# Patient Record
Sex: Female | Born: 1995 | Race: Black or African American | Hispanic: No | Marital: Single | Smoking: Never smoker
Health system: Southern US, Community
[De-identification: ages and names within clinical notes are randomized; demographics above are authoritative.]

## PROBLEM LIST (undated history)

## (undated) DIAGNOSIS — H905 Unspecified sensorineural hearing loss: Secondary | ICD-10-CM

## (undated) HISTORY — PX: WISDOM TOOTH EXTRACTION: SHX21

## (undated) HISTORY — DX: Unspecified sensorineural hearing loss: H90.5

---

## 2020-04-07 ENCOUNTER — Other Ambulatory Visit: Payer: Self-pay

## 2020-04-07 ENCOUNTER — Emergency Department (HOSPITAL_COMMUNITY)
Admission: EM | Admit: 2020-04-07 | Discharge: 2020-04-07 | Disposition: A | Discharge status: Home / Self Care | Payer: BC Managed Care – PPO | Attending: Emergency Medicine | Admitting: Emergency Medicine

## 2020-04-07 ENCOUNTER — Emergency Department (HOSPITAL_COMMUNITY): Payer: BC Managed Care – PPO

## 2020-04-07 DIAGNOSIS — R0789 Other chest pain: Secondary | ICD-10-CM | POA: Insufficient documentation

## 2020-04-07 LAB — TROPONIN I (HIGH SENSITIVITY)
Troponin I (High Sensitivity): <2 ng/L (ref <18)
Troponin I (High Sensitivity): <2 ng/L (ref <18)

## 2020-04-07 LAB — I-STAT BETA HCG BLOOD, ED (MC, WL, AP ONLY): I-stat hCG, quantitative: <5 m[IU]/mL (ref <5)

## 2020-04-07 LAB — BASIC METABOLIC PANEL
Anion gap: 12 (ref 5–15)
BUN: 9 mg/dL (ref 6–20)
CO2: 21 mmol/L — ABNORMAL LOW (ref 22–32)
Calcium: 9.4 mg/dL (ref 8.9–10.3)
Chloride: 107 mmol/L (ref 98–111)
Creatinine, Ser: 0.89 mg/dL (ref 0.44–1.00)
GFR calc Af Amer: >60 mL/min (ref >60)
GFR calc non Af Amer: >60 mL/min (ref >60)
Glucose, Bld: 99 mg/dL (ref 70–99)
Potassium: 3.9 mmol/L (ref 3.5–5.1)
Sodium: 140 mmol/L (ref 135–145)

## 2020-04-07 LAB — CBC
HCT: 41.4 % (ref 36.0–46.0)
Hemoglobin: 13.7 g/dL (ref 12.0–15.0)
MCH: 32 pg (ref 26.0–34.0)
MCHC: 33.1 g/dL (ref 30.0–36.0)
MCV: 96.7 fL (ref 80.0–100.0)
Platelets: 304 10*3/uL (ref 150–400)
RBC: 4.28 MIL/uL (ref 3.87–5.11)
RDW: 12 % (ref 11.5–15.5)
WBC: 6.8 10*3/uL (ref 4.0–10.5)
nRBC: 0 % (ref 0.0–0.2)

## 2020-04-07 MED ORDER — DICLOFENAC SODIUM 75 MG PO TBEC: 75.0000 mg | DELAYED_RELEASE_TABLET | Freq: Two times a day (BID) | ORAL | 0 refills | Status: DC

## 2020-04-07 MED ORDER — ACETAMINOPHEN 500 MG PO TABS
500.0000 mg | ORAL_TABLET | Freq: Four times a day (QID) | ORAL | 0 refills | Status: AC | PRN
Start: 2020-04-07 — End: ?

## 2020-04-07 MED ORDER — SODIUM CHLORIDE 0.9% FLUSH: 3.0000 mL | Freq: Once | INTRAVENOUS | Status: DC

## 2020-04-07 NOTE — ED Notes (Signed)
Rn notified by EMT that pt c/o CP. Pt reports to RN that CP is the same pain as earlier.

## 2020-04-07 NOTE — ED Provider Notes (Signed)
[facility] EMERGENCY DEPARTMENT Provider Note   CSN: [id number] Arrival date & time: 04/07/20  0043     History Chief Complaint  Patient presents with  . Chest Pain    Brenda Costa is a 24 y.o. female presents for evaluation of acute onset, persistent chest pains for 1 week.  Reports pain is sharp and tight, mostly along the midline radiating a little to the left.  Pain worsens with position changes, laying flat, rotating to the right.  Denies shortness of breath, abdominal pain, nausea, vomiting, lightheadedness, diaphoresis, fevers or cough.  She has not tried anything for her symptoms.  She is a non-smoker, denies recreational drug use, no family history of heart disease under the age of 50.  The history is provided by the patient.       No past medical history on file.  There are no problems to display for this patient.    OB History   No obstetric history on file.     No family history on file.  Social History   Tobacco Use  . Smoking status: Not on file  Substance Use Topics  . Alcohol use: Not on file  . Drug use: Not on file    Home Medications Prior to Admission medications   Medication Sig Start Date End Date Taking? Authorizing Provider  acetaminophen (TYLENOL) 500 MG tablet Take 1 tablet (500 mg total) by mouth every 6 (six) hours as needed. 04/07/20   Maral Lampe A, PA-C  diclofenac (VOLTAREN) 75 MG EC tablet Take 1 tablet (75 mg total) by mouth 2 (two) times daily. 04/07/20   Michela Pitcher A, PA-C    Allergies    Patient has no allergy information on record.  Review of Systems   Review of Systems  Constitutional: Negative for chills, diaphoresis and fever.  Respiratory: Negative for shortness of breath.   Cardiovascular: Positive for chest pain.  Gastrointestinal: Negative for abdominal pain, nausea and vomiting.  All other systems reviewed and are negative.   Physical Exam Updated Vital Signs BP 125/84 (BP Location: Right Arm)    Pulse 78   Temp 98.3 F (36.8 C) (Oral)   Resp 19   SpO2 100%   Physical Exam Vitals and nursing note reviewed.  Constitutional:      General: She is not in acute distress.    Appearance: She is well-developed.  HENT:     Head: Normocephalic and atraumatic.  Eyes:     General:        Right eye: No discharge.        Left eye: No discharge.     Extraocular Movements: Extraocular movements intact.     Conjunctiva/sclera: Conjunctivae normal.  Neck:     Vascular: No JVD.     Trachea: No tracheal deviation.  Cardiovascular:     Rate and Rhythm: Normal rate and regular rhythm.     Pulses:          Radial pulses are 2+ on the right side and 2+ on the left side.       Dorsalis pedis pulses are 2+ on the right side and 2+ on the left side.       Posterior tibial pulses are 2+ on the right side and 2+ on the left side.     Comments: 2+ radial and DP/PT pulses bilaterally, Homans sign absent bilaterally, no lower extremity edema, no palpable cords, compartments are soft  Pulmonary:     Effort: Pulmonary effort  is normal.     Comments: Speaking in full sentences without difficulty, SPO2 saturations 100% on room air Chest:     Chest wall: Tenderness present.       Comments: Focal left parasternal chest wall tenderness on palpation with no deformity, crepitus, ecchymosis, or flail segment. Abdominal:     General: There is no distension.     Palpations: Abdomen is soft.     Tenderness: There is no abdominal tenderness.  Musculoskeletal:     Cervical back: Normal range of motion and neck supple.     Right lower leg: No edema.     Left lower leg: No edema.  Skin:    General: Skin is warm and dry.     Findings: No erythema.  Neurological:     Mental Status: She is alert.  Psychiatric:        Behavior: Behavior normal.     ED Results / Procedures / Treatments   Labs (all labs ordered are listed, but only abnormal results are displayed) Labs Reviewed  BASIC METABOLIC PANEL  - Abnormal; Notable for the following components:      Result Value   CO2 21 (*)    All other components within normal limits  CBC  I-STAT BETA HCG BLOOD, ED ([facility], [facility], [facility] ONLY)  TROPONIN I (HIGH SENSITIVITY)  TROPONIN I (HIGH SENSITIVITY)    EKG EKG Interpretation  Date/Time:  Thursday April 07 2020 02:56:16 EDT Ventricular Rate:  86 PR Interval:  170 QRS Duration: 68 QT Interval:  348 QTC Calculation: 416 R Axis:   69 Text Interpretation: Normal sinus rhythm with sinus arrhythmia Normal ECG Confirmed by Lajean Saver [phone]) on 04/07/2020 7:39:39 AM   Radiology DG Chest 2 View  Result Date: 04/07/2020 CLINICAL DATA:  Chest pain for 1 week EXAM: CHEST - 2 VIEW COMPARISON:  None. FINDINGS: The heart size and mediastinal contours are within normal limits. Both lungs are clear. The visualized skeletal structures are unremarkable. IMPRESSION: No active cardiopulmonary disease. Electronically Signed   By: Inez Catalina M.D.   On: 04/07/2020 02:07    Procedures Procedures (including critical care time)  Medications Ordered in ED Medications  sodium chloride flush (NS) 0.9 % injection 3 mL (3 mLs Intravenous Not Given 04/07/20 0747)    ED Course  I have reviewed the triage vital signs and the nursing notes.  Pertinent labs & imaging results that were available during my care of the patient were reviewed by me and considered in my medical decision making (see chart for details).    MDM Rules/Calculators/A&P                      Patient presenting for evaluation of left-sided chest pains for 1 week. Pain is not pleuritic or exertional in nature.  It is very much so reproducible on palpation suggesting musculoskeletal etiology of symptoms.  She is afebrile, vital signs are stable and she is nontoxic in appearance.  EKG shows no acute ischemic abnormalities or concerning arrhythmia.  Chest x-ray shows no acute cardiopulmonary abnormalities.  No evidence of pneumonia or pneumothorax or  cardiomegaly.  I have a low suspicion of PE at this time.  Remainder of lab work reviewed and interpreted by myself shows no leukocytosis, no anemia, no metabolic derangements, no renal insufficiency.  Doubt dissection, cardiac tamponade, esophageal rupture, pneumonia, or acute surgical abdominal pathology and otherwise well-appearing young individual.    She has no cardiac risk factors.  Suspect his  symptoms could be related to costochondritis.  Doubt ACS/MI. Discussed symptomatic management with NSAIDs, Tylenol, gentle stretching.  Recommend follow-up with primary care provider for reevaluation of symptoms.  Discussed strict ED return precautions. Patient verbalized understanding of and agreement with plan and  is safe for discharge home at this time.    Final Clinical Impression(s) / ED Diagnoses Final diagnoses:  Atypical chest pain  Acute chest wall pain    Rx / DC Orders ED Discharge Orders         Ordered    diclofenac (VOLTAREN) 75 MG EC tablet  2 times daily     04/07/20 0822    acetaminophen (TYLENOL) 500 MG tablet  Every 6 hours PRN     04/07/20 0822           Jeanie Sewer, PA-C 04/07/20 0347    Cathren Laine, MD 04/07/20 [phone]

## 2020-04-07 NOTE — ED Triage Notes (Signed)
per pt x 1 week has been having chest pain. No SOB. Pt said she cant lay flat on her stomach or on her back. Pt said sore to touch in the center.

## 2020-04-07 NOTE — Discharge Instructions (Addendum)
Your work-up today was reassuring, with no evidence of heart attack, heart failure or other serious life-threatening condition.  I suspect your pain is likely musculoskeletal or related to a condition called costochondritis.  We typically recommend treating this with anti-inflammatories.  Take diclofenac twice daily with meals.  Do not take ibuprofen, Advil, Aleve, or Motrin while taking this medication.  You can take extra strength Tylenol every 6 hours additionally as needed for pain.  Can also apply ice or heat to the chest wall as needed for comfort, 20 minutes at a time 3-4 times daily.  Do some gentle stretching after this to avoid muscle stiffness.  You can also apply topical creams such as icy hot or Salonpas patches for pain.  Follow-up with primary care provider for reevaluation of symptoms.  Return to the emergency department if any concerning signs or symptoms develop.

## 2020-04-07 NOTE — ED Notes (Signed)
Call Pt for vitals no answer. 

## 2020-04-07 NOTE — ED Notes (Signed)
Per pt she came up to tech and said chest pain is gertting worse so brought into triage and doing another EKG

## 2020-05-10 ENCOUNTER — Ambulatory Visit: Payer: BC Managed Care – PPO | Admitting: Internal Medicine

## 2020-05-10 ENCOUNTER — Encounter: Payer: Self-pay | Admitting: Internal Medicine

## 2020-05-10 ENCOUNTER — Other Ambulatory Visit: Payer: Self-pay

## 2020-05-10 ENCOUNTER — Encounter: Payer: Self-pay | Admitting: *Deleted

## 2020-05-10 VITALS — BP 112/68 | HR 97 | Ht 67.5 in | Wt 180.4 lb

## 2020-05-10 DIAGNOSIS — R079 Chest pain, unspecified: Secondary | ICD-10-CM | POA: Diagnosis not present

## 2020-05-10 DIAGNOSIS — R002 Palpitations: Secondary | ICD-10-CM

## 2020-05-10 DIAGNOSIS — H905 Unspecified sensorineural hearing loss: Secondary | ICD-10-CM | POA: Diagnosis not present

## 2020-05-10 NOTE — Patient Instructions (Signed)
Medication Instructions:  Your Physician recommend you continue on your current medication as directed.    *If you need a refill on your cardiac medications before your next appointment, please call your pharmacy*   Lab Work: None   Testing/Procedures: Your physician has requested that you have an echocardiogram. Echocardiography is a painless test that uses sound waves to create images of your heart. It provides your doctor with information about the size and shape of your heart and how well your heart's chambers and valves are working. This procedure takes approximately one hour. There are no restrictions for this procedure. [address]  Our physician has recommended that you wear an 7  DAY ZIO-PATCH monitor. The Zio patch cardiac monitor continuously records heart rhythm data for up to 14 days, this is for patients being evaluated for multiple types heart rhythms. For the first 24 hours post application, please avoid getting the Zio monitor wet in the shower or by excessive sweating during exercise. After that, feel free to carry on with regular activities. Keep soaps and lotions away from the ZIO XT Patch.   Someone will call from our office to mail monitor.      Follow-Up: At [facility], you and your health needs are our priority.  As part of our continuing mission to provide you with exceptional heart care, we have created designated Provider Care Teams.  These Care Teams include your primary Cardiologist (physician) and Advanced Practice Providers (APPs -  Physician Assistants and Nurse Practitioners) who all work together to provide you with the care you need, when you need it.  We recommend signing up for the patient portal called "MyChart".  Sign up information is provided on this After Visit Summary.  MyChart is used to connect with patients for Virtual Visits (Telemedicine).  Patients are able to view lab/test results, encounter notes, upcoming appointments,  etc.  Non-urgent messages can be sent to your provider as well.   To learn more about what you can do with MyChart, go to [email or web address].    Your next appointment:   6 week(s)  The format for your next appointment:   In Person  Provider:   Weston Brass, MD  Christena Deem- Long Term Monitor Instructions   Your physician has requested you wear your ZIO patch monitor___7____days.   This is a single patch monitor.  [employer] supplies one patch monitor per enrollment.  Additional stickers are not available.   Please do not apply patch if you will be having a Nuclear Stress Test, Echocardiogram, Cardiac CT, MRI, or Chest Xray during the time frame you would be wearing the monitor. The patch cannot be worn during these tests.  You cannot remove and re-apply the ZIO XT patch monitor.   Your ZIO patch monitor will be sent USPS Priority mail from [facility] directly to your home address. The monitor may also be mailed to a PO BOX if home delivery is not available.   It may take 3-5 days to receive your monitor after you have been enrolled.   Once you have received you monitor, please review enclosed instructions.  Your monitor has already been registered assigning a specific monitor serial # to you.   Applying the monitor   Shave hair from upper left chest.   Hold abrader disc by orange tab.  Rub abrader in 40 strokes over left upper chest as indicated in your monitor instructions.   Clean area with 4 enclosed alcohol pads .  Use all pads to assure are is cleaned thoroughly.  Let dry.   Apply patch as indicated in monitor instructions.  Patch will be place under collarbone on left side of chest with arrow pointing upward.   Rub patch adhesive wings for 2 minutes.Remove white label marked "1".  Remove white label marked "2".  Rub patch adhesive wings for 2 additional minutes.   While looking in a mirror, press and release button in center of patch.  A small green light will  flash 3-4 times .  This will be your only indicator the monitor has been turned on.     Do not shower for the first 24 hours.  You may shower after the first 24 hours.   Press button if you feel a symptom. You will hear a small click.  Record Date, Time and Symptom in the Patient Log Book.   When you are ready to remove patch, follow instructions on last 2 pages of Patient Log Book.  Stick patch monitor onto last page of Patient Log Book.   Place Patient Log Book in [place] box.  Use locking tab on box and tape box closed securely.  The [employer] and [employer] has [employer] on it.  Please place in mailbox as soon as possible.  Your physician should have your test results approximately 7 days after the monitor has been mailed back to [facility].   Call [place] at [phone] if you have questions regarding your ZIO XT patch monitor.  Call them immediately if you see an orange light blinking on your monitor.   If your monitor falls off in less than 4 days contact our Monitor department at [phone].  If your monitor becomes loose or falls off after 4 days call [employer] at [phone] for suggestions on securing your monitor.

## 2020-05-10 NOTE — Progress Notes (Signed)
Cardiology Office Note:    Date:  05/10/2020   ID:  Florinda Marker, DOB 09/21/1996, MRN [id number]  PCP:  System, Pcp Not In  Cardiologist:  No primary care provider on file.  Electrophysiologist:  None   Referring MD: Osker Mason, NP   Chief Complaint: chest pain  History of Present Illness:    Brenda Costa is a 24 y.o. female with a history of congenital hearing loss who presents for evaluation of chest pain.   1.5 mo ago chest pain started. Left and substernal chest pain. Position change doesn't matter. 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 Comes on and lasts minutes to hours, spontaneous onset and off. Not affected by deep breathing. No major childhood illness, however does have congenital hearing loss. Uneventful pregnancy for mother, no congenital rubella or known illness during pregnancy. Chest is intermittently tender to palpitation.   She has high cholesterol but not on therapy. Lipids not available for review.   Gained weight over past 1 year. 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   Has had syncope before - in [place] at a party, got hot and felt like was going to pass out - going to door and woke up outside. Seeing black in vision was her prodrome.   Presyncope - monthly. Not with menstrual cycle. Sometimes with migraines - from prior concussions from sports.  Not vaccinated and has not gotten COVID infection.   Father - HLD, overweight, MI x 2 with stent, in 33s. Premature CAD. No SCD in family.  She has a torn achilles tendon on right that never fully healed per her report. Prior to that was working out with issue.   She has palpitations every other day. 30 minutes then lets up. No aggravating or alleviating factors.  No caffeine, no smoking. Rare ETOH.   Past Medical History:  Diagnosis Date  . Congenital hearing disorder     History reviewed. No pertinent surgical  history.  Current Medications: Current Meds  Medication Sig  . acetaminophen (TYLENOL) 500 MG tablet Take 1 tablet (500 mg total) by mouth every 6 (six) hours as needed.  . cetirizine (ZYRTEC) 10 MG tablet Take 10 mg by mouth daily.  . cholecalciferol (VITAMIN D3) 25 MCG (1000 UNIT) tablet Take 1,000 Units by mouth daily.  . rizatriptan (MAXALT) 10 MG tablet Take 10 mg by mouth as needed for migraine. May repeat in 2 hours if needed     Allergies:   Patient has no allergy information on record.   Social History   Socioeconomic History  . Marital status: Single    Spouse name: Not on file  . Number of children: Not on file  . Years of education: Not on file  . Highest education level: Not on file  Occupational History  . Not on file  Tobacco Use  . Smoking status: Never Smoker  . Smokeless tobacco: Never Used  Substance and Sexual Activity  . Alcohol use: Yes    Comment: occasionally  . Drug use: Never  . Sexual activity: Not Currently  Other Topics Concern  . Not on file  Social History Narrative  . Not on file   Social Determinants of Health   Financial Resource Strain:   . Difficulty of Paying Living Expenses:   Food Insecurity:   . Worried About Programme researcher, broadcasting/film/video in the Last Year:   . [employer] of Food in the Last Year:  Transportation Needs:   . Freight forwarder (Medical):   Marland Kitchen Lack of Transportation (Non-Medical):   Physical Activity:   . Days of Exercise per Week:   . Minutes of Exercise per Session:   Stress:   . Feeling of Stress :   Social Connections:   . Frequency of Communication with Friends and Family:   . Frequency of Social Gatherings with Friends and Family:   . Attends Religious Services:   . Active Member of Clubs or Organizations:   . Attends Banker Meetings:   Marland Kitchen Marital Status:      Family History: The patient's family history is not on file.  ROS:   Please see the history of present illness.    All other  systems reviewed and are negative.  EKGs/Labs/Other Studies Reviewed:    The following studies were reviewed today:  EKG:  NSR, sinus arrhythmia  I have independently reviewed the images from CXR 04/07/20.  Recent Labs: 04/07/2020: BUN 9; Creatinine, Ser 0.89; Hemoglobin 13.7; Platelets 304; Potassium 3.9; Sodium 140  Recent Lipid Panel No results found for: CHOL, TRIG, HDL, CHOLHDL, VLDL, LDLCALC, LDLDIRECT  Physical Exam:    VS:  BP 112/68   Pulse 97   Ht 5' 7.5" (1.715 m)   Wt 180 lb 6.4 oz (81.8 kg)   SpO2 100%   BMI 27.84 kg/m     Wt Readings from Last 5 Encounters:  05/10/20 180 lb 6.4 oz (81.8 kg)    Constitutional: No acute distress Eyes: sclera non-icteric, normal conjunctiva and lids ENMT: normal dentition, moist mucous membranes Cardiovascular: regular rhythm, normal rate, no murmurs. S1 and S2 normal. Radial pulses normal bilaterally. No jugular venous distention.  Respiratory: clear to auscultation bilaterally GI : normal bowel sounds, soft and nontender. No distention.   MSK: extremities warm, well perfused. No edema.  NEURO: grossly nonfocal exam, moves all extremities. PSYCH: alert and oriented x 3, normal mood and affect.   ASSESSMENT:    1. Chest pain, unspecified type   2. Palpitation   3. Congenital hearing loss    PLAN:    Chest pain, unspecified type - Plan: EKG 12-Lead, ECHOCARDIOGRAM COMPLETE  Palpitation - Plan: LONG TERM MONITOR (3-14 DAYS), ECHOCARDIOGRAM COMPLETE  Congenital hearing loss - Plan: ECHOCARDIOGRAM COMPLETE   She is concerned about chest pain. In the setting of a congenital hearing disorder, best to exclude concomitant congenital cardiovascular structural issues contributing to chest pain. We also need to evaluate for pericardial disease/pericarditis as source of chest pain.   Will obtain 7 day zio for palpitations.    Weston Brass, MD [place]  [facility]    Medication Adjustments/Labs and Tests  Ordered: Current medicines are reviewed at length with the patient today.  Concerns regarding medicines are outlined above.  Orders Placed This Encounter  Procedures  . LONG TERM MONITOR (3-14 DAYS)  . EKG 12-Lead  . ECHOCARDIOGRAM COMPLETE   No orders of the defined types were placed in this encounter.   Patient Instructions  Medication Instructions:  Your Physician recommend you continue on your current medication as directed.    *If you need a refill on your cardiac medications before your next appointment, please call your pharmacy*   Lab Work: None   Testing/Procedures: Your physician has requested that you have an echocardiogram. Echocardiography is a painless test that uses sound waves to create images of your heart. It provides your doctor with information about the size and shape of your heart  and how well your heart's chambers and valves are working. This procedure takes approximately one hour. There are no restrictions for this procedure. [address]  Our physician has recommended that you wear an 7  DAY ZIO-PATCH monitor. The Zio patch cardiac monitor continuously records heart rhythm data for up to 14 days, this is for patients being evaluated for multiple types heart rhythms. For the first 24 hours post application, please avoid getting the Zio monitor wet in the shower or by excessive sweating during exercise. After that, feel free to carry on with regular activities. Keep soaps and lotions away from the ZIO XT Patch.   Someone will call from our office to mail monitor.      Follow-Up: At [facility], you and your health needs are our priority.  As part of our continuing mission to provide you with exceptional heart care, we have created designated Provider Care Teams.  These Care Teams include your primary Cardiologist (physician) and Advanced Practice Providers (APPs -  Physician Assistants and Nurse Practitioners) who all work together to  provide you with the care you need, when you need it.  We recommend signing up for the patient portal called "MyChart".  Sign up information is provided on this After Visit Summary.  MyChart is used to connect with patients for Virtual Visits (Telemedicine).  Patients are able to view lab/test results, encounter notes, upcoming appointments, etc.  Non-urgent messages can be sent to your provider as well.   To learn more about what you can do with MyChart, go to [email or web address].    Your next appointment:   6 week(s)  The format for your next appointment:   In Person  Provider:   Weston Brass, MD  Christena Deem- Long Term Monitor Instructions   Your physician has requested you wear your ZIO patch monitor___7____days.   This is a single patch monitor.  [employer] supplies one patch monitor per enrollment.  Additional stickers are not available.   Please do not apply patch if you will be having a Nuclear Stress Test, Echocardiogram, Cardiac CT, MRI, or Chest Xray during the time frame you would be wearing the monitor. The patch cannot be worn during these tests.  You cannot remove and re-apply the ZIO XT patch monitor.   Your ZIO patch monitor will be sent USPS Priority mail from [facility] directly to your home address. The monitor may also be mailed to a PO BOX if home delivery is not available.   It may take 3-5 days to receive your monitor after you have been enrolled.   Once you have received you monitor, please review enclosed instructions.  Your monitor has already been registered assigning a specific monitor serial # to you.   Applying the monitor   Shave hair from upper left chest.   Hold abrader disc by orange tab.  Rub abrader in 40 strokes over left upper chest as indicated in your monitor instructions.   Clean area with 4 enclosed alcohol pads .  Use all pads to assure are is cleaned thoroughly.  Let dry.   Apply patch as indicated in monitor instructions.  Patch  will be place under collarbone on left side of chest with arrow pointing upward.   Rub patch adhesive wings for 2 minutes.Remove white label marked "1".  Remove white label marked "2".  Rub patch adhesive wings for 2 additional minutes.   While looking in a mirror, press and release button in center of  patch.  A small green light will flash 3-4 times .  This will be your only indicator the monitor has been turned on.     Do not shower for the first 24 hours.  You may shower after the first 24 hours.   Press button if you feel a symptom. You will hear a small click.  Record Date, Time and Symptom in the Patient Log Book.   When you are ready to remove patch, follow instructions on last 2 pages of Patient Log Book.  Stick patch monitor onto last page of Patient Log Book.   Place Patient Log Book in [place] box.  Use locking tab on box and tape box closed securely.  The Orange and [employer] box has [employer] postage on it.  Please place in mailbox as soon as possible.  Your physician should have your test results approximately 7 days after the monitor has been mailed back to [facility].   Call [facility] Customer Care at [phone] if you have questions regarding your ZIO XT patch monitor.  Call them immediately if you see an orange light blinking on your monitor.   If your monitor falls off in less than 4 days contact our Monitor department at [phone].  If your monitor becomes loose or falls off after 4 days call [employer] at [phone] for suggestions on securing your monitor.

## 2020-05-10 NOTE — Progress Notes (Signed)
Patient ID: Brenda Costa, female   DOB: 1996/08/17, 24 y.o.   MRN: [id number] Patient enrolled for a 7 day ZIO XT to be shipped to her home.

## 2020-05-11 ENCOUNTER — Ambulatory Visit (HOSPITAL_COMMUNITY): Payer: BC Managed Care – PPO | Attending: Cardiology

## 2020-05-11 DIAGNOSIS — R079 Chest pain, unspecified: Secondary | ICD-10-CM

## 2020-05-11 DIAGNOSIS — H905 Unspecified sensorineural hearing loss: Secondary | ICD-10-CM

## 2020-05-11 DIAGNOSIS — R002 Palpitations: Secondary | ICD-10-CM | POA: Diagnosis present

## 2020-05-12 ENCOUNTER — Ambulatory Visit (INDEPENDENT_AMBULATORY_CARE_PROVIDER_SITE_OTHER): Payer: BC Managed Care – PPO

## 2020-05-12 DIAGNOSIS — R002 Palpitations: Secondary | ICD-10-CM

## 2020-07-01 ENCOUNTER — Ambulatory Visit: Payer: BC Managed Care – PPO | Admitting: Internal Medicine

## 2020-07-05 ENCOUNTER — Telehealth (INDEPENDENT_AMBULATORY_CARE_PROVIDER_SITE_OTHER): Payer: BC Managed Care – PPO | Admitting: Internal Medicine

## 2020-07-05 ENCOUNTER — Encounter: Payer: Self-pay | Admitting: Internal Medicine

## 2020-07-05 VITALS — BP 130/79

## 2020-07-05 DIAGNOSIS — H905 Unspecified sensorineural hearing loss: Secondary | ICD-10-CM

## 2020-07-05 DIAGNOSIS — R079 Chest pain, unspecified: Secondary | ICD-10-CM

## 2020-07-05 DIAGNOSIS — R002 Palpitations: Secondary | ICD-10-CM

## 2020-07-05 MED ORDER — METOPROLOL SUCCINATE ER 25 MG PO TB24: 25.0000 mg | ORAL_TABLET | Freq: Every day | ORAL | 3 refills | Status: DC

## 2020-07-05 NOTE — Progress Notes (Signed)
Virtual Visit via Telephone Note   This visit type was conducted due to national recommendations for restrictions regarding the COVID-19 Pandemic (e.g. social distancing) in an effort to limit this patient's exposure and mitigate transmission in our community.  Due to her co-morbid illnesses, this patient is at least at moderate risk for complications without adequate follow up.  This format is felt to be most appropriate for this patient at this time.  The patient did not have access to video technology/had technical difficulties with video requiring transitioning to audio format only (telephone).  All issues noted in this document were discussed and addressed.  No physical exam could be performed with this format.  Please refer to the patient's chart for her  consent to telehealth for [facility].    Date:  07/05/2020   [name], DOB May 08, 1996, MRN [id number] The patient was identified using 2 identifiers.  Patient Location: Home Provider Location: Home Office  PCP:  System, Pcp Not In  Cardiologist:  No primary care provider on file.  Electrophysiologist:  None   Evaluation Performed:  Follow-Up Visit  Chief Complaint: Chest pain and palpitations  History of Present Illness:    Brenda Costa is a 24 y.o. female with congenital hearing loss, chest pain, palpitations.  Presents for follow-up of testing.  She continues to have chest tightness - not for last 1.5 weeks.  Shortly after she mailed in her heart monitor for 2 weeks after which she would have nearly daily tightness of her chest and feel as if her heart was "doing something weird".  Her symptoms do cause her dizziness and lightheadedness.  We reviewed results of echocardiogram which showed normal findings and likely normal coronary origins.  We reviewed results of monitor which primarily showed symptoms with sinus tachycardia.  No worrisome rhythm abnormalities were documented.  She did have her typical symptoms during  monitor.  We discussed further testing including coronary CTA to evaluate for abnormal coronary artery course as a contributor to chest tightness and continued evaluation for congenital defects.  None seen on echo.  We also discussed therapy with beta-blocker for symptoms.  The patient does not have symptoms concerning for COVID-19 infection (fever, chills, cough, or new shortness of breath).    Past Medical History:  Diagnosis Date  . Congenital hearing disorder    No past surgical history on file.   Current Meds  Medication Sig  . acetaminophen (TYLENOL) 500 MG tablet Take 1 tablet (500 mg total) by mouth every 6 (six) hours as needed.  . cetirizine (ZYRTEC) 10 MG tablet Take 10 mg by mouth daily.  . cholecalciferol (VITAMIN D3) 25 MCG (1000 UNIT) tablet Take 1,000 Units by mouth daily.  . rizatriptan (MAXALT) 10 MG tablet Take 10 mg by mouth as needed for migraine. May repeat in 2 hours if needed     Allergies:   Hydroxyzine   Social History   Tobacco Use  . Smoking status: Never Smoker  . Smokeless tobacco: Never Used  Substance Use Topics  . Alcohol use: Yes    Comment: occasionally  . Drug use: Never     Family Hx: The patient's family history includes Heart attack (age of onset: 84) in her father; Hyperlipidemia in her father.  ROS:   Please see the history of present illness.     All other systems reviewed and are negative.   Prior CV studies:   The following studies were reviewed today:    Labs/Other Tests  and Data Reviewed:    EKG:  Cardiac monitor reviewed  Recent Labs: 04/07/2020: BUN 9; Creatinine, Ser 0.89; Hemoglobin 13.7; Platelets 304; Potassium 3.9; Sodium 140   Recent Lipid Panel No results found for: CHOL, TRIG, HDL, CHOLHDL, LDLCALC, LDLDIRECT  Wt Readings from Last 3 Encounters:  05/10/20 180 lb 6.4 oz (81.8 kg)     Objective:    Vital Signs:  BP 130/79    VITAL SIGNS:  reviewed GEN:  no acute distress RESPIRATORY:  normal  respiratory effort, no increased work of breathing NEURO:  alert and oriented x 3, speech normal PSYCH:  normal affect  ASSESSMENT & PLAN:    1. Chest pain, unspecified type   2. Palpitation   3. Congenital hearing loss    She has sinus tachycardia on monitor which seems to correlate with her symptoms.  In addition she has approximately 20% of her rate above 100 bpm.  We will start with treating her symptoms with metoprolol succinate 25 mg daily.  Her symptoms may represent inappropriate sinus tachycardia or POTS.  Will reassess after trial of beta-blocker.  After approximately 6 weeks if she is seeing no benefit from beta-blocker therapy, we will consider coronary CTA.  We will also consider other therapy for POTS and inappropriate sinus tachycardia, conservative measures will be encouraged including compression stockings and adequate salt intake and hydration.  COVID-19 Education: The signs and symptoms of COVID-19 were discussed with the patient and how to seek care for testing (follow up with PCP or arrange E-visit).  The importance of social distancing was discussed today.  Time:   Today, I have spent 11 minutes with the patient with telehealth technology discussing the above problems.     Medication Adjustments/Labs and Tests Ordered: Current medicines are reviewed at length with the patient today.  Concerns regarding medicines are outlined above.   Tests Ordered: No orders of the defined types were placed in this encounter.   Medication Changes: No orders of the defined types were placed in this encounter.   Follow Up:  6 weeks virtual  Signed, Parke Poisson, MD  07/05/2020 8:35 AM    [facility]

## 2020-07-05 NOTE — Patient Instructions (Signed)
Medication Instructions:  START metoprolol succinate 25mg  daily  *If you need a refill on your cardiac medications before your next appointment, please call your pharmacy*   Follow-Up: At [facility], you and your health needs are our priority.  As part of our continuing mission to provide you with exceptional heart care, we have created designated Provider Care Teams.  These Care Teams include your primary Cardiologist (physician) and Advanced Practice Providers (APPs -  Physician Assistants and Nurse Practitioners) who all work together to provide you with the care you need, when you need it.  We recommend signing up for the patient portal called "MyChart".  Sign up information is provided on this After Visit Summary.  MyChart is used to connect with patients for Virtual Visits (Telemedicine).  Patients are able to view lab/test results, encounter notes, upcoming appointments, etc.  Non-urgent messages can be sent to your provider as well.   To learn more about what you can do with MyChart, go to [facility].    Your next appointment:   Monday Oct. 11 @ 8:40am  The format for your next appointment:   Virtual  Provider:   [id number], MD   Other Instructions  Please call and reschedule if this appointment will not work for you.

## 2020-08-15 ENCOUNTER — Telehealth (INDEPENDENT_AMBULATORY_CARE_PROVIDER_SITE_OTHER): Payer: BC Managed Care – PPO | Admitting: Internal Medicine

## 2020-08-15 VITALS — Ht 68.0 in | Wt 174.0 lb

## 2020-08-15 DIAGNOSIS — H905 Unspecified sensorineural hearing loss: Secondary | ICD-10-CM

## 2020-08-15 DIAGNOSIS — R079 Chest pain, unspecified: Secondary | ICD-10-CM | POA: Diagnosis not present

## 2020-08-15 DIAGNOSIS — Z79899 Other long term (current) drug therapy: Secondary | ICD-10-CM

## 2020-08-15 DIAGNOSIS — R002 Palpitations: Secondary | ICD-10-CM | POA: Diagnosis not present

## 2020-08-15 DIAGNOSIS — R600 Localized edema: Secondary | ICD-10-CM

## 2020-08-15 MED ORDER — METOPROLOL SUCCINATE ER 25 MG PO TB24: 50.0000 mg | ORAL_TABLET | Freq: Every day | ORAL | 3 refills | Status: DC

## 2020-08-15 NOTE — Patient Instructions (Signed)
Medication Instructions:  INCREASE METOPROLOL SUCCINATE TO 50mg  DAILY  *If you need a refill on your cardiac medications before your next appointment, please call your pharmacy*  Lab Work: None Ordered At This Time.  If you have labs (blood work) drawn today and your tests are completely normal, you will receive your results only by: MyChart Message (if you have MyChart) OR . A paper copy in the mail If you have any lab test that is abnormal or we need to change your treatment, we will call you to review the results.  Testing/Procedures: None Ordered At This Time.   Follow-Up: At [facility], you and your health needs are our priority.  As part of our continuing mission to provide you with exceptional heart care, we have created designated Provider Care Teams.  These Care Teams include your primary Cardiologist (physician) and Advanced Practice Providers (APPs -  Physician Assistants and Nurse Practitioners) who all work together to provide you with the care you need, when you need it.  We recommend signing up for the patient portal called "MyChart".  Sign up information is provided on this After Visit Summary.  MyChart is used to connect with patients for Virtual Visits (Telemedicine).  Patients are able to view lab/test results, encounter notes, upcoming appointments, etc.  Non-urgent messages can be sent to your provider as well.   To learn more about what you can do with MyChart, go to [facility].    Your next appointment:   3 month(s)  The format for your next appointment:   In Person  Provider:   [email or web address], MD  Other Instructions Dr. Weston Brass would like for you to get some compression socks to start wearing daily. You can order these online or buy from store.

## 2020-08-15 NOTE — Progress Notes (Signed)
Virtual Visit via Telephone Note   This visit type was conducted due to national recommendations for restrictions regarding the COVID-19 Pandemic (e.g. social distancing) in an effort to limit this patient's exposure and mitigate transmission in our community.  Due to her co-morbid illnesses, this patient is at least at moderate risk for complications without adequate follow up.  This format is felt to be most appropriate for this patient at this time.  The patient did not have access to video technology/had technical difficulties with video requiring transitioning to audio format only (telephone).  All issues noted in this document were discussed and addressed.  No physical exam could be performed with this format.  Please refer to the patient's chart for her  consent to telehealth for [facility].    Date:  08/15/2020   [name], DOB [birth year], MRN [id number] The patient was identified using 2 identifiers.  Patient Location: Home Provider Location: Home Office  PCP:  Pcp, No  Cardiologist:  No primary care provider on file.  Electrophysiologist:  None   Evaluation Performed:  Follow-Up Visit  Chief Complaint:  Chest pain, palpitations  History of Present Illness:    Brenda Costa is a 24 y.o. female with congenital hearing loss, chest pain, palpitations. Presents for follow-up of testing.   She has done well on metoprolol succinate 25 mg daily. She will notice on her apple watch that her HR will go over 100. We discussed this in the context of her monitor results showing >20% burden of sinus tachycardia. She is now having less palpitations, on average 2-3 times a week. No significant chest pain.   The patient does not have symptoms concerning for COVID-19 infection (fever, chills, cough, or new shortness of breath).    Past Medical History:  Diagnosis Date   Congenital hearing disorder    No past surgical history on file.   Current Meds  Medication Sig    acetaminophen (TYLENOL) 500 MG tablet Take 1 tablet (500 mg total) by mouth every 6 (six) hours as needed.   cetirizine (ZYRTEC) 10 MG tablet Take 10 mg by mouth daily.   cholecalciferol (VITAMIN D3) 25 MCG (1000 UNIT) tablet Take 1,000 Units by mouth daily.   rizatriptan (MAXALT) 10 MG tablet Take 10 mg by mouth as needed for migraine. May repeat in 2 hours if needed     Allergies:   Hydroxyzine   Social History   Tobacco Use   Smoking status: Never Smoker   Smokeless tobacco: Never Used  Substance Use Topics   Alcohol use: Yes    Comment: occasionally   Drug use: Never     Family Hx: The patient's family history includes Heart attack (age of onset: 26) in her father; Hyperlipidemia in her father.  ROS:   Please see the history of present illness.     All other systems reviewed and are negative.   Prior CV studies:   The following studies were reviewed today:  Echo, monitor  Labs/Other Tests and Data Reviewed:    EKG:  No ECG reviewed.  Recent Labs: 04/07/2020: BUN 9; Creatinine, Ser 0.89; Hemoglobin 13.7; Platelets 304; Potassium 3.9; Sodium 140   Recent Lipid Panel No results found for: CHOL, TRIG, HDL, CHOLHDL, LDLCALC, LDLDIRECT  Wt Readings from Last 3 Encounters:  08/15/20 174 lb (78.9 kg)  05/10/20 180 lb 6.4 oz (81.8 kg)     Risk Assessment/Calculations:     n/a Objective:    Vital Signs:  Ht  5\' 8"  (1.727 m)    Wt 174 lb (78.9 kg)    BMI 26.46 kg/m    VITAL SIGNS:  reviewed GEN:  no acute distress RESPIRATORY:  normal respiratory effort, no increased work of breathing NEURO:  alert and oriented x 3, speech normal PSYCH:  normal affect   ASSESSMENT & PLAN:    1. Palpitation   2. Chest pain, unspecified type   3. Congenital hearing loss   4. Lower extremity edema   5. Medication management    Palpitations have improved on metoprolol. We discussed increasing the does to monitor for continued improvement. We will try metoprolol  succinate 50 mg daily.   LE swelling - discussed use of compression socks. With normal echo, may be related to venous insufficiency. Would recommend trying OTC compression socks and if helpful, can consider graduated compression.   Chest pain - discussed CCTA, will defer currently in setting of less bothersome symptoms and normal echo.   COVID-19 Education: The signs and symptoms of COVID-19 were discussed with the patient and how to seek care for testing (follow up with PCP or arrange E-visit).  The importance of social distancing was discussed today.  Time:   Today, I have spent 20 minutes with the patient with telehealth technology discussing the above problems.     Medication Adjustments/Labs and Tests Ordered: Current medicines are reviewed at length with the patient today.  Concerns regarding medicines are outlined above.   Patient Instructions  Medication Instructions:  INCREASE METOPROLOL SUCCINATE TO 50mg  DAILY  *If you need a refill on your cardiac medications before your next appointment, please call your pharmacy*  Lab Work: None Ordered At This Time.  If you have labs (blood work) drawn today and your tests are completely normal, you will receive your results only by:  MyChart Message (if you have MyChart) OR  A paper copy in the mail If you have any lab test that is abnormal or we need to change your treatment, we will call you to review the results.  Testing/Procedures: None Ordered At This Time.   Follow-Up: At [facility], you and your health needs are our priority.  As part of our continuing mission to provide you with exceptional heart care, we have created designated Provider Care Teams.  These Care Teams include your primary Cardiologist (physician) and Advanced Practice Providers (APPs -  Physician Assistants and Nurse Practitioners) who all work together to provide you with the care you need, when you need it.  We recommend signing up for the patient  portal called "MyChart".  Sign up information is provided on this After Visit Summary.  MyChart is used to connect with patients for Virtual Visits (Telemedicine).  Patients are able to view lab/test results, encounter notes, upcoming appointments, etc.  Non-urgent messages can be sent to your provider as well.   To learn more about what you can do with MyChart, go to .    Your next appointment:   3 month(s)  The format for your next appointment:   In Person  Provider:   [facility], MD  Other Instructions [email or web address] would like for you to get some compression socks to start wearing daily. You can order these online or buy from store.    Signed, Weston Brass, MD  08/15/2020 8:25 AM    [facility]

## 2020-11-15 ENCOUNTER — Ambulatory Visit: Payer: Medicaid Other | Admitting: Internal Medicine

## 2020-12-15 ENCOUNTER — Ambulatory Visit: Payer: Medicaid Other | Admitting: Internal Medicine

## 2021-06-04 IMAGING — CR DG CHEST 2V
2 series · 2 of 2 positions shown · non-contrast
Comparison: None.

CLINICAL DATA: Chest pain for 1 week

EXAM:
CHEST - 2 VIEW

[chest pa]
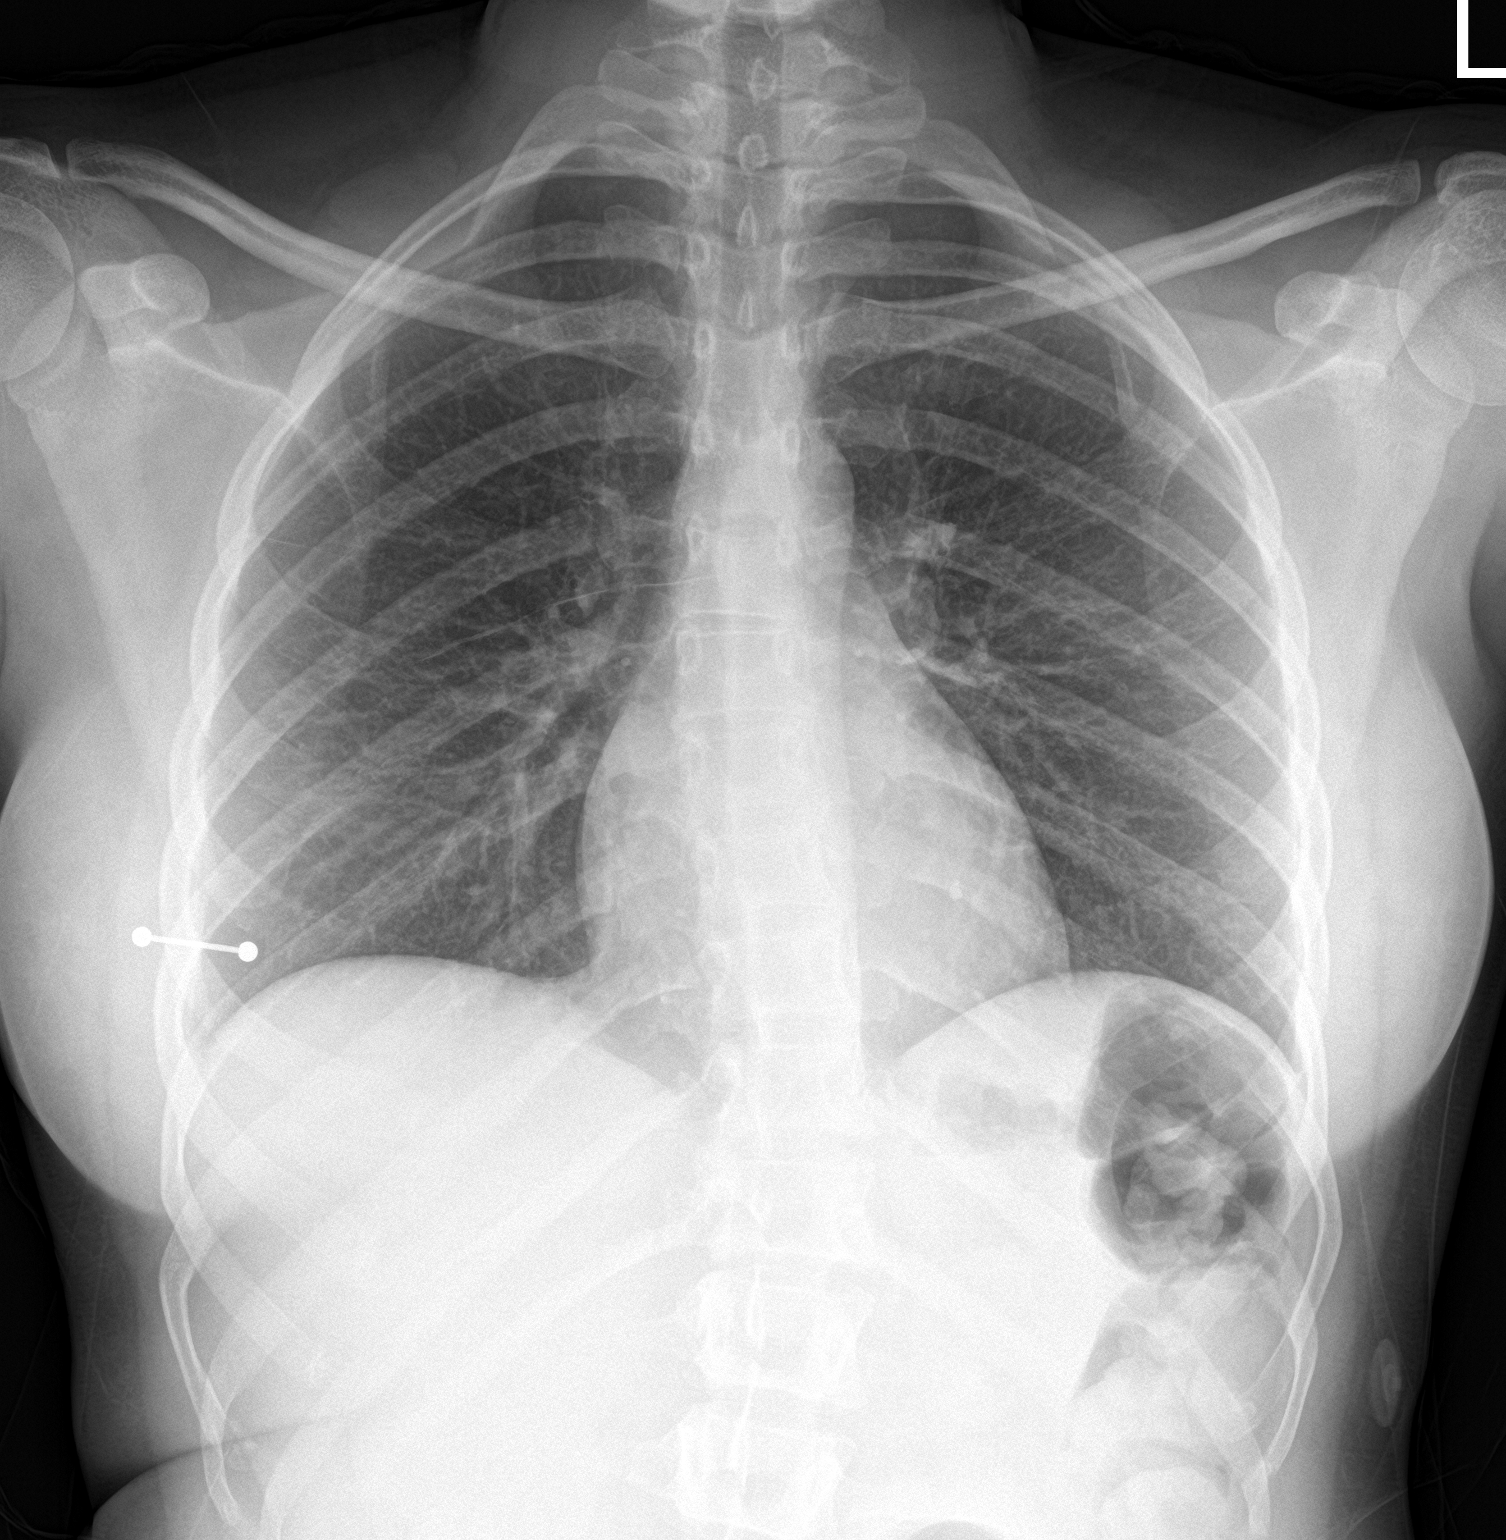

[chest lat]
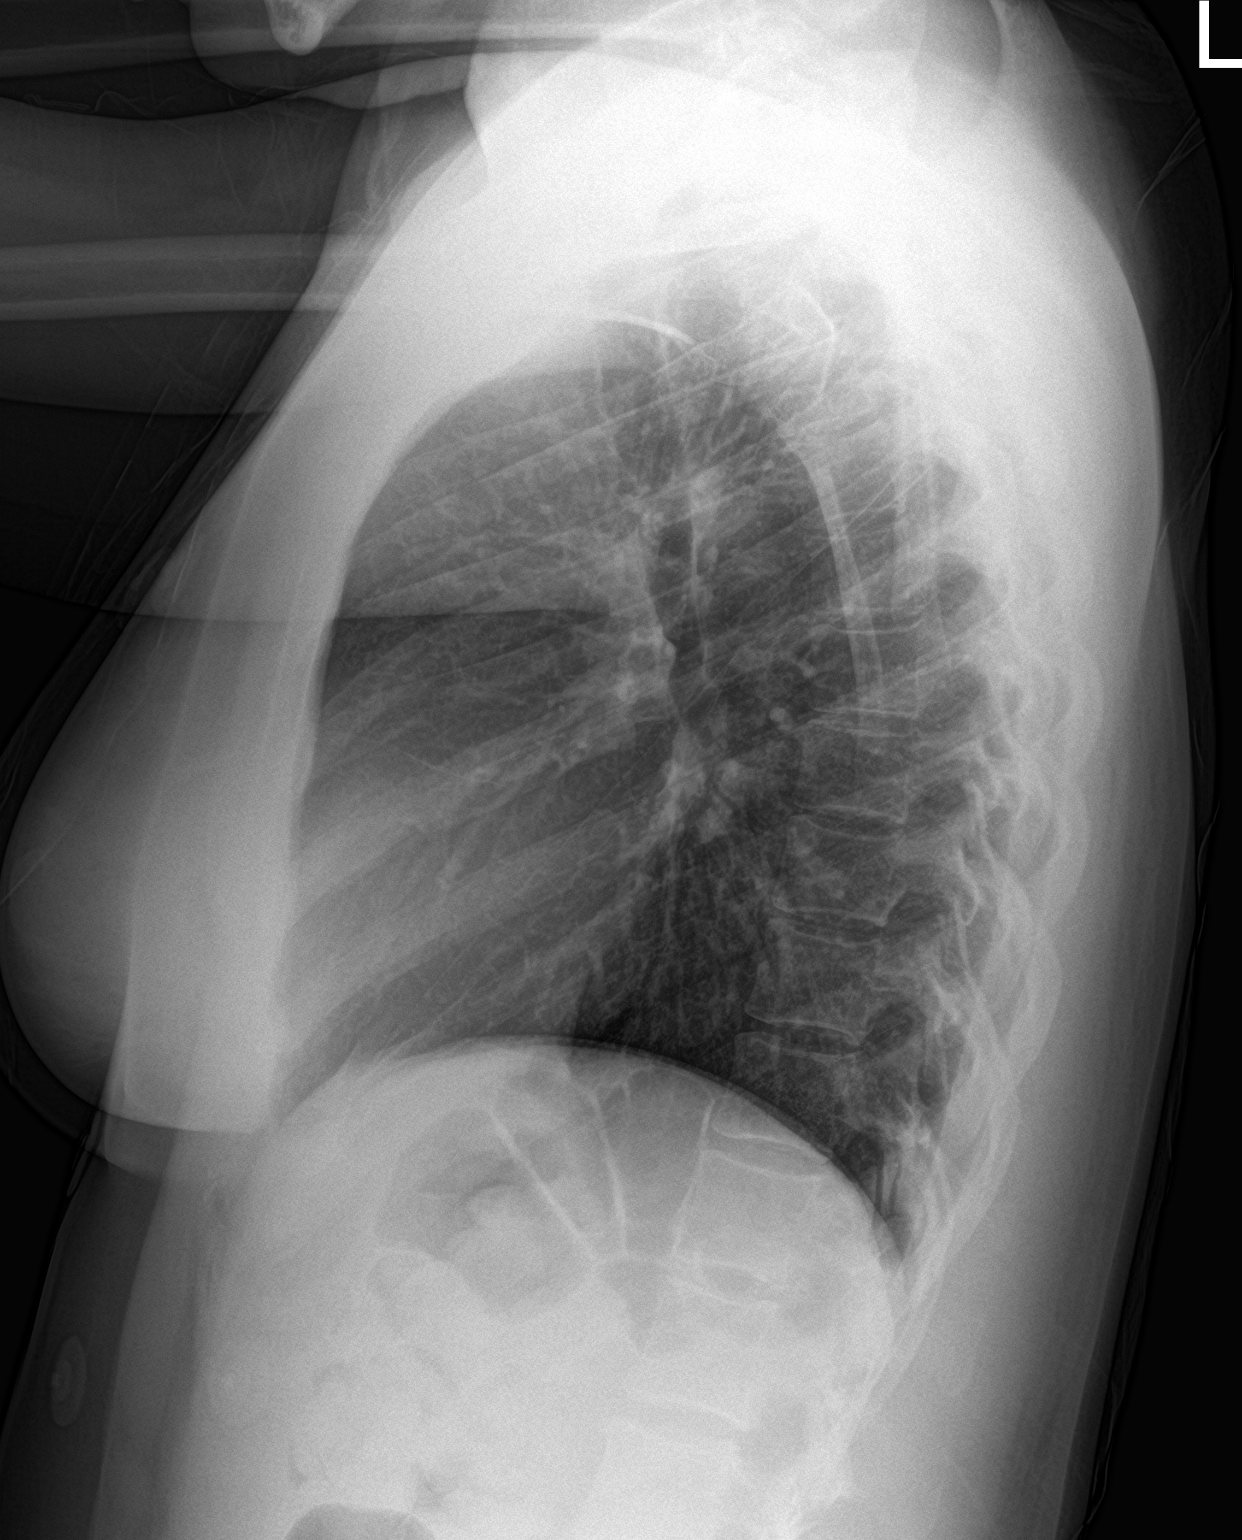

[2 of 2 positions shown; findings below may reference images not displayed]

FINDINGS: The heart size and mediastinal contours are within normal limits.
Both lungs are clear. The visualized skeletal structures are
unremarkable.
IMPRESSION: No active cardiopulmonary disease.

## 2021-11-24 ENCOUNTER — Encounter: Payer: Self-pay | Admitting: Neurology

## 2021-11-24 ENCOUNTER — Ambulatory Visit: Payer: BC Managed Care – PPO | Admitting: Neurology

## 2021-11-24 VITALS — BP 123/80 | HR 83 | Ht 68.0 in | Wt 175.0 lb

## 2021-11-24 DIAGNOSIS — R519 Headache, unspecified: Secondary | ICD-10-CM

## 2021-11-24 DIAGNOSIS — G43009 Migraine without aura, not intractable, without status migrainosus: Secondary | ICD-10-CM | POA: Diagnosis not present

## 2021-11-24 DIAGNOSIS — R51 Headache with orthostatic component, not elsewhere classified: Secondary | ICD-10-CM

## 2021-11-24 DIAGNOSIS — H539 Unspecified visual disturbance: Secondary | ICD-10-CM

## 2021-11-24 DIAGNOSIS — H5462 Unqualified visual loss, left eye, normal vision right eye: Secondary | ICD-10-CM

## 2021-11-24 DIAGNOSIS — H905 Unspecified sensorineural hearing loss: Secondary | ICD-10-CM

## 2021-11-24 MED ORDER — UBRELVY 100 MG PO TABS: 100.0000 mg | ORAL_TABLET | ORAL | 11 refills | Status: DC | PRN

## 2021-11-24 MED ORDER — QULIPTA 60 MG PO TABS: 60.0000 mg | ORAL_TABLET | Freq: Every day | ORAL | 11 refills | Status: AC

## 2021-11-24 NOTE — Progress Notes (Signed)
[facility]    Provider:  Dr Jaynee Eagles Requesting Provider: No ref. provider found Primary Care Provider:  Pcp, No  CC:  headache  HPI:  Brenda Costa is a 26 y.o. female here as requested by No ref. provider found for migraines. PMHx congenital hearing disorder, migraines.   Had them for years, since she was around 32, mother has had migraines in the past, She has had a few concussions 4 of them in HS. Migraines get severe, she has to go the emergency room, lately having them every other day. Migraines every other day 14 total migraine + headache days a month on average, pulsating/pounding/throbbing, constant pain, light sensitivity, she had her eyes checked just the other day, she has had to tint her car window because the light is a trigger, she gets nausea, blurred vision, she had vision loss out of the left eye for a few weeks, they are "taking her out" they are so bad, she hasn;t been able to go to work, a dark room helps. She states thyroid was checked, past >3 months migraines are getting worse. Headaches can be in the morning, positional. No other focal neurologic deficits, associated symptoms, inciting events or modifiable factors.  Reviewed notes, labs and imaging from outside physicians, which showed   She had a thyroid workup in march 2022 tsh .417  then checked again .7 and ft4 was normal in march 2022  Tried metoprolol, rizatriptan, imitrex, topamax, lexapro. Amitriptyline contraindicated because she is already on a seratonin drug Lexapro, nortriptyline, maxalt  I have reviewed emergency room notes from November 08, 2021 [facility], patient presented for migraine, having a headache on and off for the last 3 weeks, Maxalt not helping, over-the-counter medications without relief, she felt nauseated and having vision problems, nausea causing problems, been to her primary doctor for migraines, she denied any recent illnesses, fevers, chills, cough, congestion, sore  throat, no neck pain or stiffness or soreness.  Described as constant throbbing.  Physical and neurologic exam were both normal.  No imaging was ordered.  They provided supportive care with IV analgesics, headache cocktail including Benadryl, Compazine, Toradol.  She felt better and was released.  She was also given a prescription for Fioricet.  Review of Systems: Patient complains of symptoms per HPI as well as the following symptoms hearing loss. Pertinent negatives and positives per HPI. All others negative.   Social History   Socioeconomic History   Marital status: Single    Spouse name: Not on file   Number of children: Not on file   Years of education: Not on file   Highest education level: Not on file  Occupational History   Not on file  Tobacco Use   Smoking status: Never   Smokeless tobacco: Never  Vaping Use   Vaping Use: Never used  Substance and Sexual Activity   Alcohol use: Yes    Comment: occasionally   Drug use: Never   Sexual activity: Not Currently  Other Topics Concern   Not on file  Social History Narrative   Lives with her parents   Right handed   Caffeine: rarely    Social Determinants of Health   Financial Resource Strain: Not on file  Food Insecurity: Not on file  Transportation Needs: Not on file  Physical Activity: Not on file  Stress: Not on file  Social Connections: Not on file  Intimate Partner Violence: Not on file    Family History  Problem Relation Age of Onset  Heart attack Father 74   Hyperlipidemia Father    Migraines Brother     Past Medical History:  Diagnosis Date   Congenital hearing disorder     Patient Active Problem List   Diagnosis Date Noted   Migraine without aura and without status migrainosus, not intractable 11/26/2021   Congenital hearing loss 11/26/2021    Past Surgical History:  Procedure Laterality Date   WISDOM TOOTH EXTRACTION      Current Outpatient Medications  Medication Sig Dispense Refill    acetaminophen (TYLENOL) 500 MG tablet Take 1 tablet (500 mg total) by mouth every 6 (six) hours as needed. 30 tablet 0   Atogepant (QULIPTA) 60 MG TABS Take 60 mg by mouth daily. 30 tablet 11   cetirizine (ZYRTEC) 10 MG tablet Take 10 mg by mouth daily.     escitalopram (LEXAPRO) 10 MG tablet Take 10 mg by mouth daily.     Prenatal 27-1 MG TABS Take 1 tablet by mouth daily.     rizatriptan (MAXALT) 10 MG tablet Take 10 mg by mouth as needed for migraine. May repeat in 2 hours if needed     Ubrogepant ([name]) 100 MG TABS Take 100 mg by mouth every 2 (two) hours as needed. Maximum 200mg  a day. 16 tablet 11   No current facility-administered medications for this visit.    Allergies as of 11/24/2021 - Review Complete 11/24/2021  Allergen Reaction Noted   Hydroxyzine  07/05/2020    Vitals: BP 123/80 (BP Location: Left Arm, Patient Position: Sitting)    Pulse 83    Ht 5\' 8"  (1.727 m)    Wt 175 lb (79.4 kg)    LMP 10/28/2021    BMI 26.61 kg/m  Last Weight:  Wt Readings from Last 1 Encounters:  11/24/21 175 lb (79.4 kg)   Last Height:   Ht Readings from Last 1 Encounters:  11/24/21 5\' 8"  (1.727 m)     Physical exam: Exam: Gen: NAD, conversant, well nourised, well groomed                     CV: RRR, no MRG. No Carotid Bruits. No peripheral edema, warm, nontender Eyes: Conjunctivae clear without exudates or hemorrhage  Neuro: Detailed Neurologic Exam  Speech:    Speech is normal; fluent and spontaneous with normal comprehension.  Cognition:    The patient is oriented to person, place, and time;     recent and remote memory intact;     language fluent;     normal attention, concentration,     fund of knowledge Cranial Nerves:    The pupils are equal, round, and reactive to light. The fundi are flat. Visual fields are full to finger confrontation. Extraocular movements are intact. Trigeminal sensation is intact and the muscles of mastication are normal. The face is symmetric.  The palate elevates in the midline. Hearing impaired. Voice is normal. Shoulder shrug is normal. The tongue has normal motion without fasciculations.   Coordination:    Normal   Gait:    normal.   Motor Observation:    No asymmetry, no atrophy, and no involuntary movements noted. Tone:    Normal muscle tone.    Posture:    Posture is normal. normal erect    Strength:    Strength is V/V in the upper and lower limbs.      Sensation: intact to LT     Reflex Exam:  DTR's:    Deep tendon reflexes  in the upper and lower extremities are normal bilaterally.   Toes:    The toes are downgoing bilaterally.   Clonus:    Clonus is absent.    Assessment/Plan:  Patient with chronic headaches and migraines but given concerning symptoms including left eye vision changes needs thorough evaluation.  MRI of the brain w/wo contrast: MRI brain due to concerning symptoms of morning headaches, positional headaches,vision changes  to look for space occupying mass, chiari or intracranial hypertension (pseudotumor).  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 If it does not improve the symptoms please take one additional tablet. Do not take more then 2 tablets in 24hrs. Do not take use more then 2 to 3 times in a week.  Orders Placed This Encounter  Procedures   MR BRAIN W WO CONTRAST   CBC with Differential/Platelets   Comprehensive metabolic panel   Meds ordered this encounter  Medications   Ubrogepant ([name]) 100 MG TABS    Sig: Take 100 mg by mouth every 2 (two) hours as needed. Maximum 200mg  a day.    Dispense:  16 tablet    Refill:  11    Failed topamax, metoprolol, amitriptyline/nortriptyline, imitrex, maxalt, zomig    Atogepant (QULIPTA) 60 MG TABS    Sig: Take 60 mg by mouth daily.    Dispense:  30 tablet    Refill:  11    Failed topamax, metoprolol, amitriptyline/nortriptyline, imitrex, maxalt, zomig    Cc: No ref. provider found,  Pcp, No  Sarina Ill, MD  [facility] [address] [place], [place] [address]  Phone [phone] Fax [phone]

## 2021-11-24 NOTE — Patient Instructions (Addendum)
MRI brain w/wo contrast Blood work [place] start with samples: once daily [name]: Please take one tablet at the onset of your headache. If it does not improve the symptoms please take one additional tablet. Do not take more then 2 tablets in 24hrs. Do not take use more then 2 to 3 times in a week.  Ubrogepant tablets What is this medication? UBROGEPANT (ue BROE je pant) is used to treat migraine headaches with or without aura. An aura is a strange feeling or visual disturbance that warns you of an attack. It is not used to prevent migraines. This medicine may be used for other purposes; ask your health care provider or pharmacist if you have questions. COMMON BRAND NAME(S): Roselyn Meier What should I tell my care team before I take this medication? They need to know if you have any of these conditions: kidney disease liver disease an unusual or allergic reaction to ubrogepant, other medicines, foods, dyes, or preservatives pregnant or trying to get pregnant breast-feeding How should I use this medication? Take this medicine by mouth with a glass of water. Follow the directions on the prescription label. You can take it with or without food. If it upsets your stomach, take it with food. Take your medicine at regular intervals. Do not take it more often than directed. Do not stop taking except on your doctor's advice. Talk to your pediatrician about the use of this medicine in children. Special care may be needed. Overdosage: If you think you have taken too much of this medicine contact a poison control center or emergency room at once. NOTE: This medicine is only for you. Do not share this medicine with others. What if I miss a dose? This does not apply. This medicine is not for regular use. What may interact with this medication? Do not take this medicine with any of the following medicines: ceritinib certain antibiotics like chloramphenicol, clarithromycin, telithromycin certain antivirals  for HIV like atazanavir, cobicistat, darunavir, delavirdine, fosamprenavir, indinavir, ritonavir certain medicines for fungal infections like itraconazole, ketoconazole, posaconazole, voriconazole conivaptan grapefruit idelalisib mifepristone nefazodone ribociclib This medicine may also interact with the following medications: carvedilol certain medicines for seizures like phenobarbital, phenytoin ciprofloxacin cyclosporine eltrombopag fluconazole fluvoxamine quinidine rifampin St. John's wort verapamil This list may not describe all possible interactions. Give your health care provider a list of all the medicines, herbs, non-prescription drugs, or dietary supplements you use. Also tell them if you smoke, drink alcohol, or use illegal drugs. Some items may interact with your medicine. What should I watch for while using this medication? Visit your health care professional for regular checks on your progress. Tell your health care professional if your symptoms do not start to get better or if they get worse. Your mouth may get dry. Chewing sugarless gum or sucking hard candy and drinking plenty of water may help. Contact your health care professional if the problem does not go away or is severe. What side effects may I notice from receiving this medication? Side effects that you should report to your doctor or health care professional as soon as possible: allergic reactions like skin rash, itching or hives; swelling of the face, lips, or tongue Side effects that usually do not require medical attention (report these to your doctor or health care professional if they continue or are bothersome): drowsiness dry mouth nausea tiredness This list may not describe all possible side effects. Call your doctor for medical advice about side effects. You may report side effects to FDA  at 1-800-FDA-1088. Where should I keep my medication? Keep out of the reach of children. Store at room  temperature between 15 and 30 degrees C (59 and 86 degrees F). Throw away any unused medicine after the expiration date. NOTE: This sheet is a summary. It may not cover all possible information. If you have questions about this medicine, talk to your doctor, pharmacist, or health care provider.  2022 Elsevier/Gold Standard (2019-01-08 00:00:00) Atogepant tablets What is this medication? ATOGEPANT (a TOE je pant) is used to prevent migraine headaches. This medicine may be used for other purposes; ask your health care provider or pharmacist if you have questions. COMMON BRAND NAME(S): QULIPTA What should I tell my care team before I take this medication? They need to know if you have any of these conditions: kidney disease liver disease an unusual or allergic reaction to atogepant, other medicines, foods, dyes, or preservatives pregnant or trying to get pregnant breast-feeding How should I use this medication? Take this medicine by mouth with water. Take it as directed on the prescription label at the same time every day. You can take it with or without food. If it upsets your stomach, take it with food. Keep taking it unless your health care provider tells you to stop. Talk to your health care provider about the use of this medicine in children. Special care may be needed. Overdosage: If you think you have taken too much of this medicine contact a poison control center or emergency room at once. NOTE: This medicine is only for you. Do not share this medicine with others. What if I miss a dose? If you miss a dose, take it as soon as you can. If it is almost time for your next dose, take only that dose. Do not take double or extra doses. What may interact with this medication? carbamazepine certain medicines for fungal infections like itraconazole, ketoconazole clarithromycin cyclosporine efavirenz etravirine phenytoin rifampin St. John's Wort This list may not describe all possible  interactions. Give your health care provider a list of all the medicines, herbs, non-prescription drugs, or dietary supplements you use. Also tell them if you smoke, drink alcohol, or use illegal drugs. Some items may interact with your medicine. What should I watch for while using this medication? Visit your health care provider for regular checks on your progress. Tell your health care provider if your symptoms do not start to get better or if they get worse. What side effects may I notice from receiving this medication? Side effects that you should report to your doctor or health care provider as soon as possible: allergic reactions (skin rash, itching or hives; swelling of the face, lips, tongue) light-colored stool liver injury (dark yellow or brown urine; general ill feeling or flu-like symptoms; loss of appetite, right upper belly pain; unusually weak or tired, yellowing of the eyes or skin) Side effects that usually do not require medical attention (report these to your doctor or health care provider if they continue or are bothersome): constipation lack or loss of appetite nausea unusually weak or tired weight loss This list may not describe all possible side effects. Call your doctor for medical advice about side effects. You may report side effects to FDA at 1-800-FDA-1088. Where should I keep my medication? Keep out of the reach of children and pets. Store at room temperature between 20 and 25 degrees C (68 and 77 degrees F). Get rid of any unused medicine after the expiration date. To get rid  of medicines that are no longer needed or have expired: Take the medicine to a medicine take-back program. Check with your pharmacy or law enforcement to find a location. If you cannot return the medicine, check the label or package insert to see if the medicine should be thrown out in the garbage or flushed down the toilet. If you are not sure, ask your health care provider. If it is safe to  put it in the trash, take the medicine out of the container. Mix the medicine with cat litter, dirt, coffee grounds, or other unwanted substance. Seal the mixture in a bag or container. Put it in the trash. NOTE: This sheet is a summary. It may not cover all possible information. If you have questions about this medicine, talk to your doctor, pharmacist, or health care provider.  2022 Elsevier/Gold Standard (2020-08-08 00:00:00)

## 2021-11-25 LAB — CBC WITH DIFFERENTIAL/PLATELET
Basophils Absolute: 0 10*3/uL (ref 0.0–0.2)
Basos: 0 %
EOS (ABSOLUTE): 0.1 10*3/uL (ref 0.0–0.4)
Eos: 2 %
Hematocrit: 39.4 % (ref 34.0–46.6)
Hemoglobin: 12.9 g/dL (ref 11.1–15.9)
Immature Grans (Abs): 0 10*3/uL (ref 0.0–0.1)
Immature Granulocytes: 0 %
Lymphocytes Absolute: 2.2 10*3/uL (ref 0.7–3.1)
Lymphs: 43 %
MCH: 31.9 pg (ref 26.6–33.0)
MCHC: 32.7 g/dL (ref 31.5–35.7)
MCV: 97 fL (ref 79–97)
Monocytes Absolute: 0.3 10*3/uL (ref 0.1–0.9)
Monocytes: 7 %
Neutrophils Absolute: 2.4 10*3/uL (ref 1.4–7.0)
Neutrophils: 48 %
Platelets: 267 10*3/uL (ref 150–450)
RBC: 4.05 x10E6/uL (ref 3.77–5.28)
RDW: 11.8 % (ref 11.7–15.4)
WBC: 5.1 10*3/uL (ref 3.4–10.8)

## 2021-11-25 LAB — COMPREHENSIVE METABOLIC PANEL
ALT: 10 IU/L (ref 0–32)
AST: 13 IU/L (ref 0–40)
Albumin/Globulin Ratio: 2 (ref 1.2–2.2)
Albumin: 4.6 g/dL (ref 3.9–5.0)
Alkaline Phosphatase: 73 IU/L (ref 44–121)
BUN/Creatinine Ratio: 8 — ABNORMAL LOW (ref 9–23)
BUN: 5 mg/dL — ABNORMAL LOW (ref 6–20)
Bilirubin Total: 0.5 mg/dL (ref 0.0–1.2)
CO2: 23 mmol/L (ref 20–29)
Calcium: 9.4 mg/dL (ref 8.7–10.2)
Chloride: 104 mmol/L (ref 96–106)
Creatinine, Ser: 0.66 mg/dL (ref 0.57–1.00)
Globulin, Total: 2.3 g/dL (ref 1.5–4.5)
Glucose: 90 mg/dL (ref 70–99)
Potassium: 4 mmol/L (ref 3.5–5.2)
Sodium: 140 mmol/L (ref 134–144)
Total Protein: 6.9 g/dL (ref 6.0–8.5)
eGFR: 124 mL/min/{1.73_m2} (ref >59)

## 2021-11-26 ENCOUNTER — Encounter: Payer: Self-pay | Admitting: Neurology

## 2021-11-26 DIAGNOSIS — H905 Unspecified sensorineural hearing loss: Secondary | ICD-10-CM | POA: Insufficient documentation

## 2021-11-26 DIAGNOSIS — G43009 Migraine without aura, not intractable, without status migrainosus: Secondary | ICD-10-CM | POA: Insufficient documentation

## 2021-11-28 ENCOUNTER — Telehealth: Payer: Self-pay | Admitting: *Deleted

## 2021-11-28 ENCOUNTER — Telehealth: Payer: Self-pay | Admitting: Neurology

## 2021-11-28 ENCOUNTER — Encounter: Payer: Self-pay | Admitting: Neurology

## 2021-11-28 NOTE — Telephone Encounter (Signed)
MR Brain w/wo contrast Dr. Ihor Dow Josem Kaufmann: [id number] (exp. 11/27/21 to 12/26/21). Patient is scheduled at [facility] for 12/06/21.  She did inform me she is claustrophobic and would like something to help her. Sie is aware to have a driver.

## 2021-11-28 NOTE — Telephone Encounter (Signed)
°  Liviana Knebel KeyErroll Luna - Rx #: [id number]  PA for Vernie Ammons was sent this afternoon waiting on approval

## 2021-11-28 NOTE — Telephone Encounter (Signed)
Approvedtoday Effective from 11/28/2021 through 04/17/202  PA Ubrevly was approved  will contact patient through my chart to make her aware of approval

## 2021-11-29 ENCOUNTER — Other Ambulatory Visit: Payer: Self-pay | Admitting: Neurology

## 2021-11-29 MED ORDER — ALPRAZOLAM 0.25 MG PO TABS
ORAL_TABLET | ORAL | 0 refills | Status: AC
Start: 2021-11-29 — End: ?

## 2021-12-06 ENCOUNTER — Ambulatory Visit: Payer: BC Managed Care – PPO

## 2021-12-06 DIAGNOSIS — H539 Unspecified visual disturbance: Secondary | ICD-10-CM

## 2021-12-06 DIAGNOSIS — R51 Headache with orthostatic component, not elsewhere classified: Secondary | ICD-10-CM

## 2021-12-06 DIAGNOSIS — R519 Headache, unspecified: Secondary | ICD-10-CM

## 2021-12-06 DIAGNOSIS — H5462 Unqualified visual loss, left eye, normal vision right eye: Secondary | ICD-10-CM

## 2021-12-06 MED ORDER — GADOBENATE DIMEGLUMINE 529 MG/ML IV SOLN
15.0000 mL | Freq: Once | INTRAVENOUS | Status: AC | PRN
  Administered 2021-12-06: 15 mL via INTRAVENOUS

## 2021-12-10 ENCOUNTER — Encounter: Payer: Self-pay | Admitting: Neurology

## 2021-12-26 ENCOUNTER — Encounter: Payer: Self-pay | Admitting: Neurology

## 2021-12-26 NOTE — Telephone Encounter (Signed)
I called pt.  She states the dizziness that she has is about 2 sec long, every 03-14-29 min.  Not like her other dizziness from her migraines.   Not related to position.  On Friday she said she got all hot/ sweaty.  Almost passed out.  I instructed that dizziness can be caused by a lot of different things.  Recommend to see pcp, as she lives in [place], near [place], [place] she has not vision issues.  Just the short episodes of dizziness.  She feels hydrated/eating ok, has Bp machine at work.  I relayed that per Dr. Lucia Gaskins, can see NP to evaluate sooner if needed.  She will see pcp first then call us back.  Pt ok with this.

## 2021-12-28 ENCOUNTER — Encounter: Payer: Self-pay | Admitting: Neurology

## 2022-01-23 ENCOUNTER — Encounter: Payer: Self-pay | Admitting: Neurology

## 2022-01-24 ENCOUNTER — Telehealth: Payer: BC Managed Care – PPO | Admitting: Neurology

## 2022-01-25 ENCOUNTER — Telehealth (INDEPENDENT_AMBULATORY_CARE_PROVIDER_SITE_OTHER): Payer: BC Managed Care – PPO | Admitting: Neurology

## 2022-01-25 DIAGNOSIS — G43009 Migraine without aura, not intractable, without status migrainosus: Secondary | ICD-10-CM | POA: Diagnosis not present

## 2022-01-25 MED ORDER — NURTEC 75 MG PO TBDP: 75.0000 mg | ORAL_TABLET | Freq: Every day | ORAL | 6 refills | Status: AC | PRN

## 2022-01-25 NOTE — Progress Notes (Signed)
?[facility] ? ? ? ?Provider:  Dr Lucia GaskinsAhern ?Requesting Provider: No ref. provider found ?Primary Care Provider:  Pcp, No ? ?Virtual Visit via Video Note ? ?I connected with Brenda Costa on 01/25/2022 at  1:00 PM EDT by a video enabled telemedicine application and verified that I am speaking with the correct person using two identifiers. ? ?Location: ?Patient: home ?Provider: office ?  ?I discussed the limitations of evaluation and management by telemedicine and the availability of in person appointments. The patient expressed understanding and agreed to proceed. ? ? ?Follow Up Instructions: ? ?  ?I discussed the assessment and treatment plan with the patient. The patient was provided an opportunity to ask questions and all were answered. The patient agreed with the plan and demonstrated an understanding of the instructions. ?  ?The patient was advised to call back or seek an in-person evaluation if the symptoms worsen or if the condition fails to improve as anticipated. ? ?I provided 30 minutes of non-face-to-face time during this encounter. ? ? ?Anson FretAhern, Jaelin Devincentis B, MD ? ? ?CC:  headache ? ?At least 50% improvement. Bernita RaisinUbrelvy is not great when she does have migraines.  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 She will send me the MRI CDs to compare from 2017 and 2012. Then we will see if we need a follow up MRi or not pending if what we saw is stable. Will change to nurtec and see if that works better acutely.  ? ?MRI brain: personally reviewed images and agree: FINDINGS:  ?  ?No abnormal lesions are seen on diffusion-weighted views to suggest acute ischemia. The cortical sulci, fissures and cisterns are normal in size and appearance. Lateral, third and fourth ventricle are normal in size and appearance. No extra-axial fluid collections are seen. No evidence of mass effect or midline shift.  No abnormal lesions are seen on post contrast views.   ?  ?Simple midline  cerebellar cystic lesion (10x10x7710mm AP x trans x SI). On sagittal views it appears to be in communication with the subarachnoid space but not clear. 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  ?  ?On sagittal views the posterior fossa, pituitary gland and corpus callosum are unremarkable. No evidence of intracranial hemorrhage on gradient-echo views. The orbits and their contents, paranasal sinuses and calvarium are unremarkable.  Intracranial flow voids are present. ?  ?  ?IMPRESSION:  ?  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 Consider follow up study to ensure stability. ?- Remainder of brain parenchyma is unremarkable. ? ?Patient complains of symptoms per HPI as well as the following symptoms: migraine . Pertinent negatives and positives per HPI. All others negative ? ? ?HPI:  Brenda Costa is a 26 y.o. female here as requested by No ref. provider found for migraines. PMHx congenital hearing disorder, migraines.  ? ?Had them for years, since she was around [age], mother has had migraines in the past, She has had a few concussions 4 of them in HS. Migraines get severe, she has to go the emergency room, lately having them every other day. Migraines every other day 14 total migraine + headache days a month on average, pulsating/pounding/throbbing, constant pain, light sensitivity, she had her eyes checked just the other day, she has had to tint her car window because the light is a trigger, she gets nausea, blurred  vision, she had vision loss out of the left eye for a few weeks, they are "taking her out" they are so bad, she hasn;t been able to go to work, a dark room helps. She states thyroid was checked, past >3 months migraines are getting worse. Headaches can be in the morning, positional. No other focal neurologic deficits, associated symptoms,  inciting events or modifiable factors. ? ?Reviewed notes, labs and imaging from outside physicians, which showed  ? ?She had a thyroid workup in march 2022 tsh .417  then checked again .7 and ft4 was normal in march 2022 ? ?Tried metoprolol, rizatriptan, imitrex, topamax, lexapro. Amitriptyline contraindicated because she is already on a seratonin drug Lexapro, nortriptyline, maxalt ? ?I have reviewed emergency room notes from November 08, 2021 [facility], patient presented for migraine, having a headache on and off for the last 3 weeks, Maxalt not helping, over-the-counter medications without relief, she felt nauseated and having vision problems, nausea causing problems, been to her primary doctor for migraines, she denied any recent illnesses, fevers, chills, cough, congestion, sore throat, no neck pain or stiffness or soreness.  Described as constant throbbing.  Physical and neurologic exam were both normal.  No imaging was ordered.  They provided supportive care with IV analgesics, headache cocktail including Benadryl, Compazine, Toradol.  She felt better and was released.  She was also given a prescription for Fioricet. ? ?Review of Systems: ?Patient complains of symptoms per HPI as well as the following symptoms hearing loss. Pertinent negatives and positives per HPI. All others negative. ? ? ?Social History  ? ?Socioeconomic History  ? Marital status: Single  ?  Spouse name: Not on file  ? Number of children: Not on file  ? Years of education: Not on file  ? Highest education level: Not on file  ?Occupational History  ? Not on file  ?Tobacco Use  ? Smoking status: Never  ? Smokeless tobacco: Never  ?Vaping Use  ? Vaping Use: Never used  ?Substance and Sexual Activity  ? Alcohol use: Yes  ?  Comment: occasionally  ? Drug use: Never  ? Sexual activity: Not Currently  ?Other Topics Concern  ? Not on file  ?Social History Narrative  ? Lives with her parents  ? Right handed  ? Caffeine: rarely   ? ?Social  Determinants of Health  ? ?Financial Resource Strain: Not on file  ?Food Insecurity: Not on file  ?Transportation Needs: Not on file  ?Physical Activity: Not on file  ?Stress: Not on file  ?Social Connections: Not on file  ?Intimate Partner Violence: Not on file  ? ? ?Family History  ?Problem Relation Age of Onset  ? Heart attack Father 60  ? Hyperlipidemia Father   ? Migraines Brother   ? ? ?Past Medical History:  ?Diagnosis Date  ? Congenital hearing disorder   ? ? ?Patient Active Problem List  ? Diagnosis Date Noted  ? Migraine without aura and without status migrainosus, not intractable 11/26/2021  ? Congenital hearing loss 11/26/2021  ? ? ?Past Surgical History:  ?Procedure Laterality Date  ? WISDOM TOOTH EXTRACTION    ? ? ?Current Outpatient Medications  ?Medication Sig Dispense Refill  ? Rimegepant Sulfate (NURTEC) 75 MG TBDP Take 75 mg by mouth daily as needed. For migraines. Take as close to onset of migraine as possible. One daily maximum. 16 tablet 6  ? acetaminophen (TYLENOL) 500 MG tablet Take 1 tablet (500 mg total) by mouth every 6 (six) hours as  needed. 30 tablet 0  ? ALPRAZolam (XANAX) 0.25 MG tablet Take 1-2 tabs (0.25mg -0.50mg ) 30-60 minutes before procedure. May repeat if needed.Do not drive. 4 tablet 0  ? Atogepant (QULIPTA) 60 MG TABS Take 60 mg by mouth daily. 30 tablet 11  ? cetirizine (ZYRTEC) 10 MG tablet Take 10 mg by mouth daily.    ? escitalopram (LEXAPRO) 10 MG tablet Take 10 mg by mouth daily.    ? Prenatal 27-1 MG TABS Take 1 tablet by mouth daily.    ? rizatriptan (MAXALT) 10 MG tablet Take 10 mg by mouth as needed for migraine. May repeat in 2 hours if needed    ? ?No current facility-administered medications for this visit.  ? ? ?Allergies as of 01/25/2022 - Review Complete 11/24/2021  ?Allergen Reaction Noted  ? Hydroxyzine  07/05/2020  ? ? ?Vitals: ?There were no vitals taken for this visit. ?Last Weight:  ?Wt Readings from Last 1 Encounters:  ?11/24/21 175 lb (79.4 kg)  ? ?Last  Height:   ?Ht Readings from Last 1 Encounters:  ?11/24/21 5\' 8"  (1.727 m)  ? ? ? ?Physical exam: ?Exam: ?Gen: NAD, conversant      ?CV: Denies palpitations or chest pain or SOB. ?VS: Breathing at a normal rate. Weight ap

## 2022-01-30 ENCOUNTER — Encounter: Payer: Self-pay | Admitting: *Deleted

## 2022-01-30 NOTE — Telephone Encounter (Signed)
Nurtec PA was signed, faxed to [facility] pharmacy so they can process and send to [facility]. Received a receipt of confirmation. ? ?

## 2022-02-06 NOTE — Telephone Encounter (Signed)
[employer] called stating Nurtec was approved from 02/05/22-04/29/22. ?

## 2022-02-24 ENCOUNTER — Encounter: Payer: Self-pay | Admitting: Neurology

## 2022-04-17 ENCOUNTER — Telehealth: Payer: Self-pay | Admitting: *Deleted

## 2022-04-17 NOTE — Telephone Encounter (Signed)
Submitted PA Nurtec on CMM. Key: [id number]. Waiting on determination from [place].

## 2022-04-18 NOTE — Telephone Encounter (Addendum)
PA approved effective from 04/17/2022 through 04/16/2023 for qty 8/30 days.

## 2022-05-31 ENCOUNTER — Encounter: Payer: Self-pay | Admitting: Neurology

## 2022-09-26 ENCOUNTER — Encounter: Payer: Self-pay | Admitting: Neurology

## 2022-10-11 ENCOUNTER — Telehealth: Payer: Self-pay | Admitting: *Deleted

## 2022-10-11 NOTE — Telephone Encounter (Signed)
Pt cd will be mailed out on today. The image will be push to power share. Iam waiting on the reports.

## 2022-10-15 NOTE — Telephone Encounter (Signed)
You can send her these its fine since we have a baseline MRI thanks

## 2022-10-15 NOTE — Telephone Encounter (Signed)
Pod4 Let patient know the cyst we saw on 2023 imaging was there in 2015 as well and has not really changed. 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 It has gotten very slightly larger since 2015. I would probably repeat MRi brain February this year and then every few years after that just to keep an eye on it but hasn;t really changed. We are also sending her copies of her MRIs too (thanks debra)  Debra - did you get extra copies of her MRIs to send her, in other words can I keep the CD on y desk?

## 2022-10-15 NOTE — Telephone Encounter (Signed)
I called pt Brenda Costa relayed the message per Dr. Lucia Gaskins:   ok per DPR to LM on her cell #.    The cyst we saw on 2023 imaging was there in 2015 as well and has not really changed. 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 It has gotten very slightly larger since 2015. She would probably repeat MRi brain February this year and then every few years after that just to keep an eye on it but hasn;t really changed. We are also sending her copies of her MRIs too  (per [facility] S in medical records)  Pt is to call back if questions.

## 2023-04-06 ENCOUNTER — Telehealth: Payer: Self-pay

## 2023-04-06 ENCOUNTER — Other Ambulatory Visit (HOSPITAL_COMMUNITY): Payer: Self-pay

## 2023-04-06 NOTE — Telephone Encounter (Signed)
Pharmacy Patient Advocate Encounter   Received notification from [facility] that prior authorization for Nurtec 75MG  dispersible tablets is required/requested.   PA submitted to [facility] via CoverMyMeds Key or (Medicaid) confirmation # [id number] Status is pending

## 2023-04-12 ENCOUNTER — Other Ambulatory Visit (HOSPITAL_COMMUNITY): Payer: Self-pay
